# Patient Record
Sex: Male | Born: 1955 | Race: White | Hispanic: No | Marital: Married | Smoking: Never smoker
Health system: Southern US, Community
[De-identification: ages and names within clinical notes are randomized; demographics above are authoritative.]

---

## 2006-05-28 ENCOUNTER — Ambulatory Visit: Payer: Self-pay | Admitting: Internal Medicine

## 2006-06-02 ENCOUNTER — Ambulatory Visit: Payer: Self-pay | Admitting: Internal Medicine

## 2008-12-08 ENCOUNTER — Encounter: Admission: RE | Admit: 2008-12-08 | Discharge: 2008-12-08 | Payer: Self-pay | Admitting: Family Medicine

## 2009-09-11 ENCOUNTER — Encounter: Admission: RE | Admit: 2009-09-11 | Discharge: 2009-09-11 | Payer: Self-pay | Admitting: Neurological Surgery

## 2010-08-04 ENCOUNTER — Encounter: Payer: Self-pay | Admitting: Neurological Surgery

## 2010-11-29 NOTE — Assessment & Plan Note (Signed)
[facility]                           GASTROENTEROLOGY OFFICE NOTE   NAME:[name] [name]                        MRN:          [id number]  DATE:05/28/2006                            DOB:          19-Sep-1955    CHIEF COMPLAINT:  Rectal bleeding.   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  He developed some rectal bleeding on the  tissue paper in July.  At that time he had a perianal fungal infection in  the gluteal crease.  That eventually resolved after some treatment and then  perhaps just spontaneously resolved.  Then, he started having blood on the  tissue paper again on November 1 and then two or three times he saw a streak  of red blood on the outside of a bowel movement.  There is a [name] bit of a  tearing sensation when he defecates.  He has no abdominal pain or other  gastrointestinal symptoms at this time.   MEDICATIONS:  1. Aspirin 81 mg daily.  2. Saw palmetto daily.  3. Flomax 0.4 mg daily.  4. Fish oil twice daily.   DRUG ALLERGIES:  None known.   PAST MEDICAL HISTORY:  1. Allergies.  2. Prostatitis, resolved.  3. 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   FAMILY HISTORY:  No colon cancer reported.  Otherwise, our review is  negative.   SOCIAL HISTORY:  He is married.  He is a professor at [employer].  One son, one  daughter.  No alcohol, tobacco or drugs.   REVIEW OF SYSTEMS:  Eyeglasses and allergies.  All other systems negative at  this time.   PHYSICAL EXAMINATION:  GENERAL:  Well-developed, well-nourished, no acute  distress.  VITAL SIGNS:  Weight 189 pounds, blood pressure 122/82, pulse 88.  ABDOMEN:  Abdomen is soft, nontender, without organomegaly or mass.  RECTAL:  Shows heme negative brown stool, no mass, no protruding hemorrhoids  or other lesions.  It is nontender.  GENITOURINARY:  Prostate is normal.  PSYCHIATRIC:  He is alert and oriented x3.   ASSESSMENT:  Rectal bleeding.  He is 50.  Probably anorectal but  could be  something more significant.   PLAN:  Proceed with colonoscopy to investigate the rectal bleeding.  Further  plans pending that.  Risks, benefits and indications are explained.  He  understands and agrees to proceed.     [name] Boop, MD,FACG  Electronically Signed    CEG/MedQ  DD: 05/28/2006  DT: 05/28/2006  Job #: [id number]   cc:   [name] [name], M.D.

## 2011-12-05 ENCOUNTER — Other Ambulatory Visit: Payer: Self-pay | Admitting: Family Medicine

## 2011-12-05 DIAGNOSIS — M545 Low back pain: Secondary | ICD-10-CM

## 2011-12-17 ENCOUNTER — Ambulatory Visit
Admission: RE | Admit: 2011-12-17 | Discharge: 2011-12-17 | Disposition: A | Discharge status: Home / Self Care | Payer: BC Managed Care – PPO | Source: Ambulatory Visit | Attending: Family Medicine | Admitting: Family Medicine

## 2011-12-17 DIAGNOSIS — M545 Low back pain: Secondary | ICD-10-CM

## 2012-01-12 ENCOUNTER — Other Ambulatory Visit: Payer: Self-pay

## 2013-12-02 ENCOUNTER — Other Ambulatory Visit: Payer: Self-pay | Admitting: Family Medicine

## 2013-12-02 DIAGNOSIS — R0981 Nasal congestion: Secondary | ICD-10-CM

## 2013-12-13 ENCOUNTER — Ambulatory Visit
Admission: RE | Admit: 2013-12-13 | Discharge: 2013-12-13 | Disposition: A | Discharge status: Home / Self Care | Payer: BC Managed Care – PPO | Source: Ambulatory Visit | Attending: Family Medicine | Admitting: Family Medicine

## 2013-12-13 DIAGNOSIS — R0981 Nasal congestion: Secondary | ICD-10-CM

## 2014-01-06 ENCOUNTER — Telehealth: Payer: Self-pay | Admitting: *Deleted

## 2014-01-06 NOTE — Telephone Encounter (Signed)
I had some orthotics that worked well back in 2010 that I got from the [facility].  I need them replaced.  How do I go about doing that?  Leave a message on my machine.  I called and left him a message to give us a call to schedule an appointment with one of the doctors then we can get him scanned to get a new pair of orthotics.

## 2014-01-09 ENCOUNTER — Ambulatory Visit (INDEPENDENT_AMBULATORY_CARE_PROVIDER_SITE_OTHER): Payer: BC Managed Care – PPO | Admitting: Podiatry

## 2014-01-09 ENCOUNTER — Encounter: Payer: Self-pay | Admitting: Podiatry

## 2014-01-09 ENCOUNTER — Ambulatory Visit (INDEPENDENT_AMBULATORY_CARE_PROVIDER_SITE_OTHER): Payer: BC Managed Care – PPO

## 2014-01-09 VITALS — BP 124/82 | HR 77 | Resp 16 | Ht 72.0 in | Wt 192.0 lb

## 2014-01-09 DIAGNOSIS — M948X9 Other specified disorders of cartilage, unspecified sites: Secondary | ICD-10-CM

## 2014-01-09 DIAGNOSIS — M779 Enthesopathy, unspecified: Secondary | ICD-10-CM

## 2014-01-09 MED ORDER — TRIAMCINOLONE ACETONIDE 10 MG/ML IJ SUSP
10.0000 mg | Freq: Once | INTRAMUSCULAR | Status: AC
  Administered 2014-01-09: 10 mg

## 2014-01-09 NOTE — Progress Notes (Signed)
Subjective:     Patient ID: [name] Green, male   DOB: 11/07/1955, 58 y.o.   MRN: [id number]  Foot Pain   patient presents stating my foot has been hurting quite a bit recently and I went to the orthopedic Dr. who did not do anything for me and I have inserted from 5 years ago which are not effective   Review of Systems  All other systems reviewed and are negative.      Objective:   Physical Exam  Nursing note and vitals reviewed. Constitutional: He is oriented to person, place, and time.  Cardiovascular: Intact distal pulses.   Musculoskeletal: Normal range of motion.  Neurological: He is oriented to person, place, and time.  Skin: Skin is warm.   neurovascular status intact with muscle strength adequate in range of motion within normal limits. Patient's digits are well-perfused and patient's arch height is mildly diminished upon weightbearing. 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 Reappoint when orthotics ready

## 2014-01-09 NOTE — Progress Notes (Signed)
   Subjective:    Patient ID: [name] Green, male    DOB: 01/07/1956, 58 y.o.   MRN: [id number]  HPI Comments: "I have pain in the bottom of the big toe area"  Patient c/o aching plantar 1st MPJ right for about 2 months. This is a reoccurring problem. He has seen Dr Irving ShowsEgerton before and was made orthotics. He is go see orthopedist and they xrayed and said no fracture or sesamoiditis. He Rx'd voltaren but hasn't helped.  Going to [place] on Thursday.  Foot Pain      Review of Systems  HENT: Positive for sinus pressure.   Musculoskeletal: Positive for back pain and gait problem.  All other systems reviewed and are negative.      Objective:   Physical Exam        Assessment & Plan:

## 2014-01-30 ENCOUNTER — Other Ambulatory Visit: Payer: BC Managed Care – PPO

## 2014-02-02 ENCOUNTER — Ambulatory Visit (INDEPENDENT_AMBULATORY_CARE_PROVIDER_SITE_OTHER): Payer: BC Managed Care – PPO | Admitting: *Deleted

## 2014-02-02 DIAGNOSIS — M779 Enthesopathy, unspecified: Secondary | ICD-10-CM

## 2014-02-02 NOTE — Progress Notes (Signed)
   Subjective:    Patient ID: [name] Green, male    DOB: 04/06/1956, 58 y.o.   MRN: [id number]  HPI  puo and given instruction.    Review of Systems     Objective:   Physical Exam        Assessment & Plan:

## 2014-02-02 NOTE — Patient Instructions (Signed)

## 2014-10-04 ENCOUNTER — Encounter: Payer: Self-pay | Admitting: Internal Medicine

## 2014-11-15 ENCOUNTER — Other Ambulatory Visit: Payer: Self-pay | Admitting: Urology

## 2014-11-15 DIAGNOSIS — R1031 Right lower quadrant pain: Principal | ICD-10-CM

## 2014-11-15 DIAGNOSIS — G8929 Other chronic pain: Secondary | ICD-10-CM

## 2014-11-20 ENCOUNTER — Ambulatory Visit
Admission: RE | Admit: 2014-11-20 | Discharge: 2014-11-20 | Disposition: A | Discharge status: Home / Self Care | Payer: BC Managed Care – PPO | Source: Ambulatory Visit | Attending: Urology | Admitting: Urology

## 2014-11-20 DIAGNOSIS — R1031 Right lower quadrant pain: Principal | ICD-10-CM

## 2014-11-20 DIAGNOSIS — G8929 Other chronic pain: Secondary | ICD-10-CM

## 2014-11-20 MED ORDER — IOHEXOL 300 MG/ML  SOLN
100.0000 mL | Freq: Once | INTRAMUSCULAR | Status: AC | PRN
  Administered 2014-11-20: 100 mL via INTRAVENOUS

## 2016-05-21 ENCOUNTER — Encounter: Payer: Self-pay | Admitting: Internal Medicine

## 2017-03-04 ENCOUNTER — Ambulatory Visit (INDEPENDENT_AMBULATORY_CARE_PROVIDER_SITE_OTHER): Payer: BC Managed Care – PPO | Admitting: Orthotics

## 2017-03-04 DIAGNOSIS — M2021 Hallux rigidus, right foot: Secondary | ICD-10-CM

## 2017-03-04 NOTE — Progress Notes (Signed)
Patient came in today to pick up custom made foot orthotics.  The goals were accomplished and the patient reported no dissatisfaction with said orthotics.  Patient was advised of breakin period and how to report any issues.  Patient was ADVISED and understood, that the $199.00 2nd/3rd pair was ONLY applicable to additional pairs that were requested within 6 months of original dispensing date.    Since it has been over a year since he has seen dr [name] Green, and two years since Dr [name] Green, patient was advised that the cost billed to [facility] would be $398.00.  He was fine with that.

## 2020-03-14 ENCOUNTER — Other Ambulatory Visit: Payer: Self-pay | Admitting: Orthopedic Surgery

## 2020-03-14 DIAGNOSIS — M545 Low back pain, unspecified: Secondary | ICD-10-CM

## 2020-04-10 ENCOUNTER — Other Ambulatory Visit: Payer: BC Managed Care – PPO

## 2020-05-16 ENCOUNTER — Other Ambulatory Visit: Payer: BC Managed Care – PPO

## 2020-07-17 ENCOUNTER — Ambulatory Visit
Admission: RE | Admit: 2020-07-17 | Discharge: 2020-07-17 | Disposition: A | Discharge status: Home / Self Care | Payer: BC Managed Care – PPO | Source: Ambulatory Visit | Attending: Orthopedic Surgery | Admitting: Orthopedic Surgery

## 2020-07-17 DIAGNOSIS — M545 Low back pain, unspecified: Secondary | ICD-10-CM

## 2022-05-24 IMAGING — MR MR LUMBAR SPINE W/O CM
4 of 5 series · 26 of 48 positions shown · non-contrast
Comparison: Plain films lumbar spine 10/31/2019. MRI lumbar spine
12/17/2011.

CLINICAL DATA: Low back pain for 6 months.  No known injury.

EXAM:
MRI LUMBAR SPINE WITHOUT CONTRAST
TECHNIQUE: Multiplanar, multisequence MR imaging of the lumbar spine was
performed. No intravenous contrast was administered.

[Series 3: T2 post-contrast · sagittal · 4.0mm · 0.57mm/px · 6 of 16 slices shown]
[im 1/16]
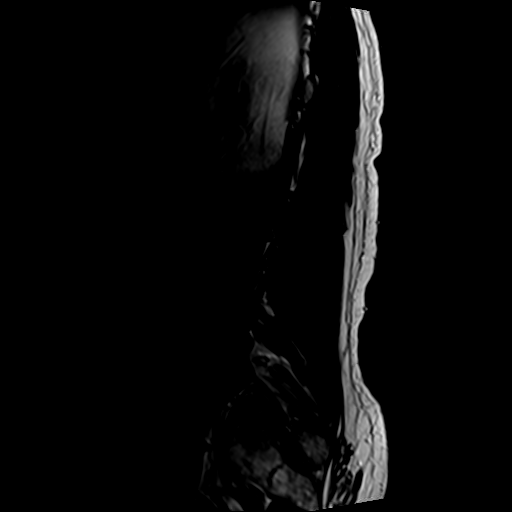
[im 4/16]
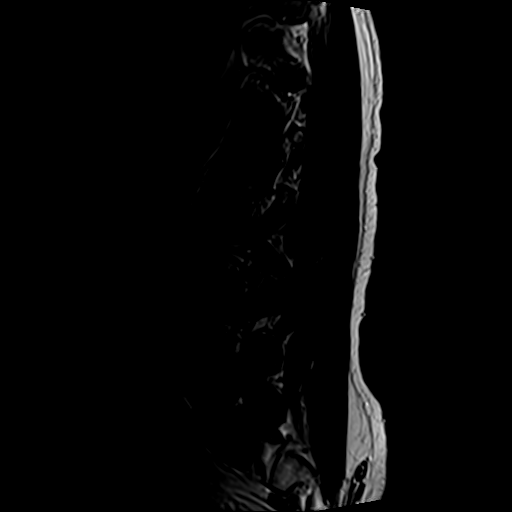
[im 7/16]
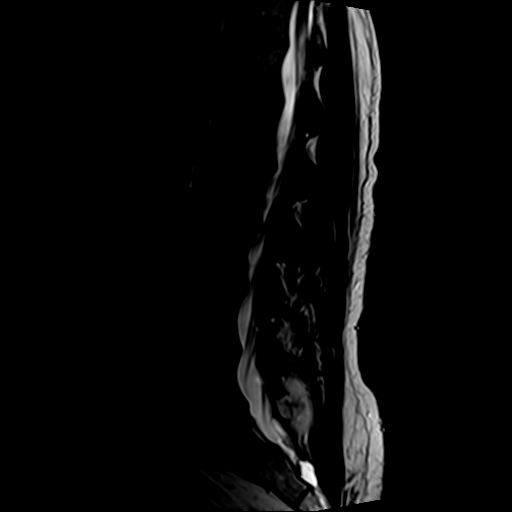
[im 10/16]
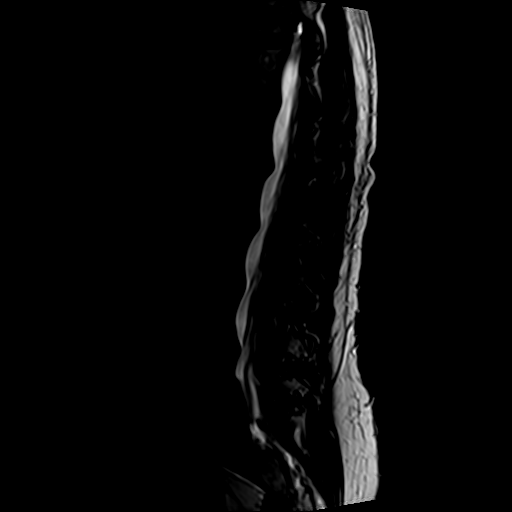
[im 13/16]
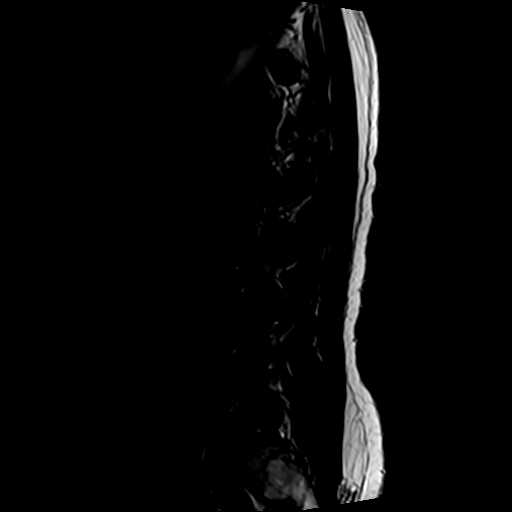
[im 16/16]
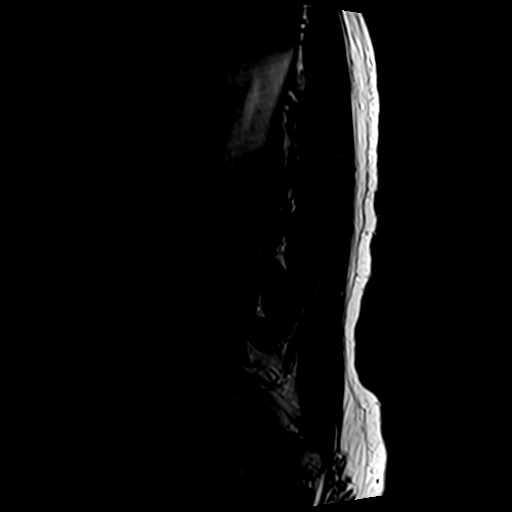

[Series 5: T1 · sagittal · 4.0mm · 0.57mm/px · 6 of 16 slices shown (1 of 2)]
[im 1/16]
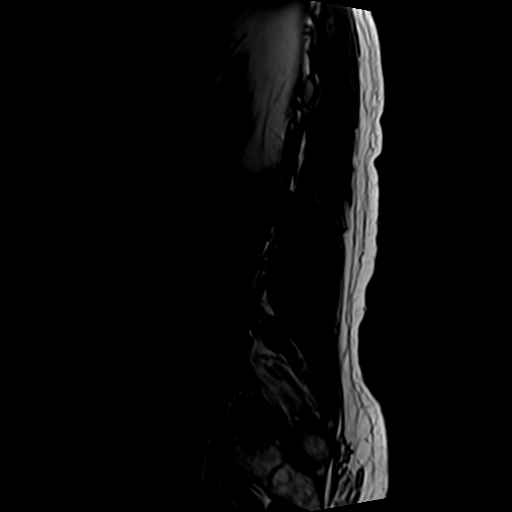
[im 4/16]
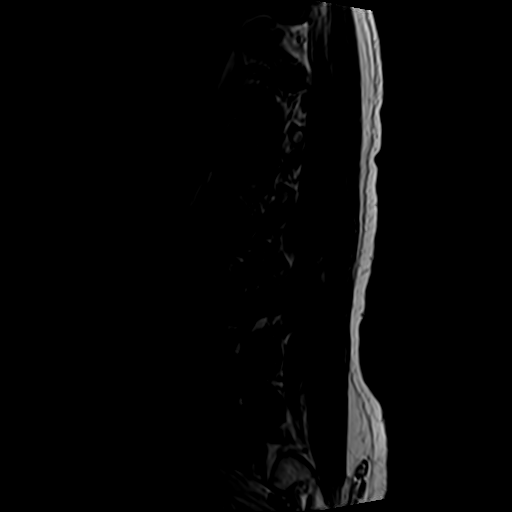
[im 7/16]
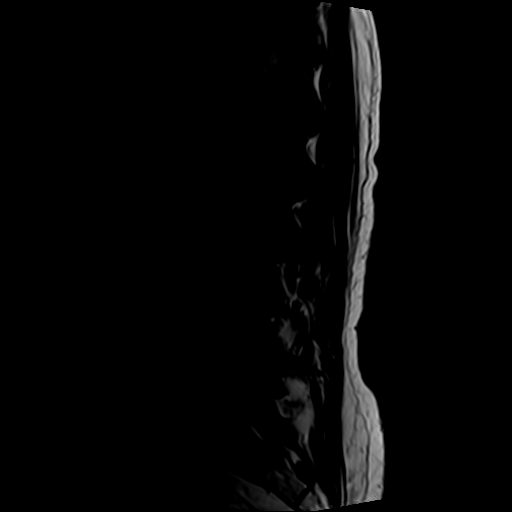
[im 10/16]
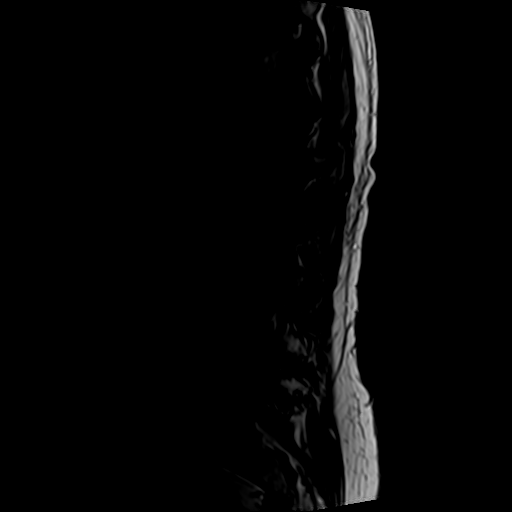
[im 13/16]
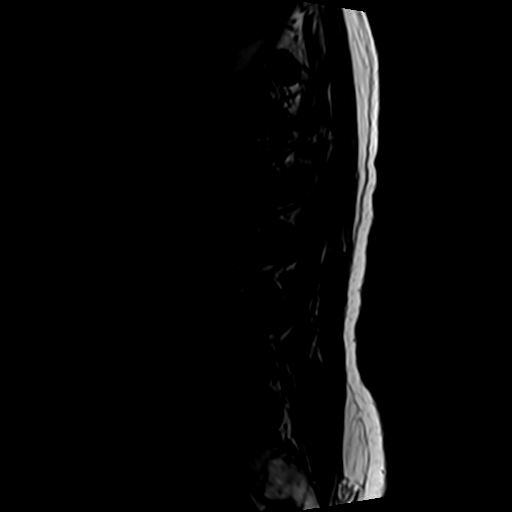
[im 16/16]
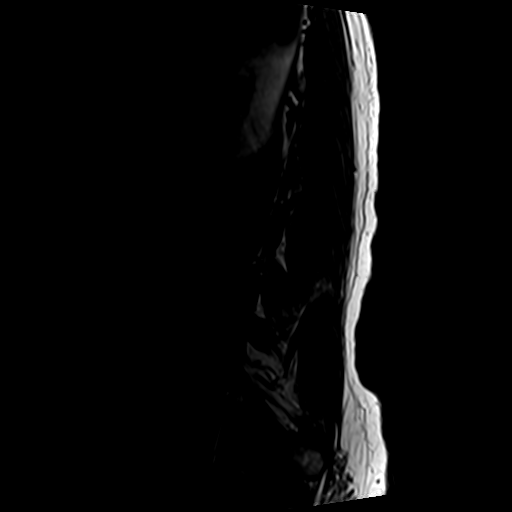

[Series 6: T2 · axial · 4.0mm · 0.70mm/px · z∈[-158,+68]mm · 9 of 40 slices shown]
[im 1/40]
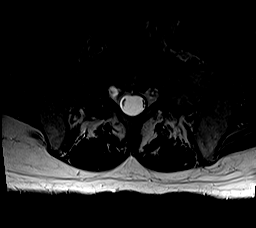
[im 6/40]
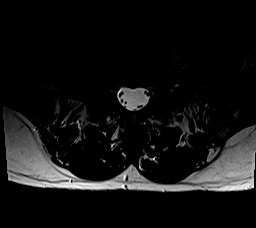
[im 12/40]
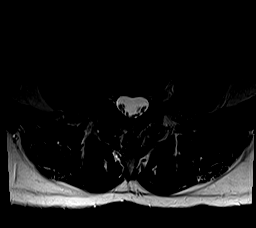
[im 17/40]
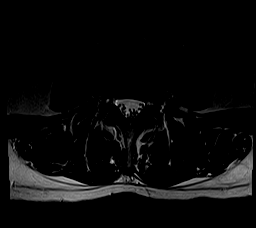
[im 20/40]
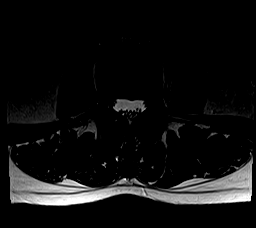
[im 23/40]
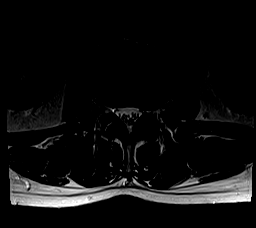
[im 28/40]
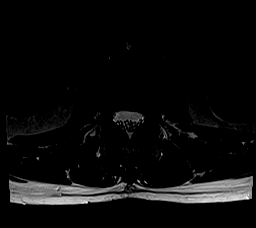
[im 34/40]
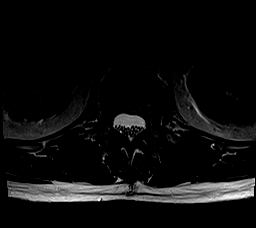
[im 40/40]
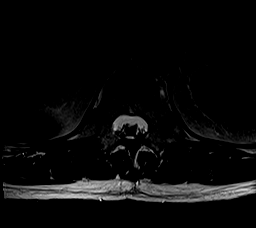

[Series 7: T1 · axial · 4.0mm · 0.35mm/px · z∈[-158,+37]mm · 5 of 40 slices shown (2 of 2)]
[im 1/40]
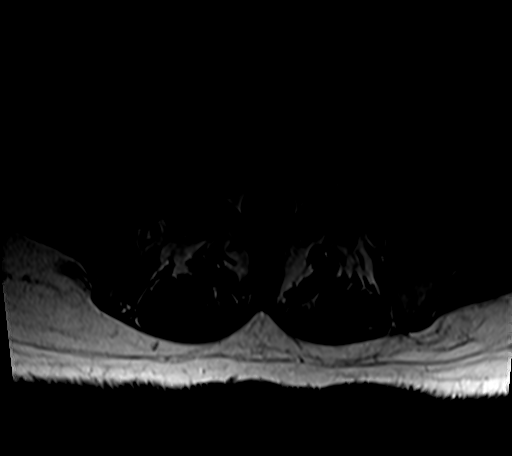
[im 6/40]
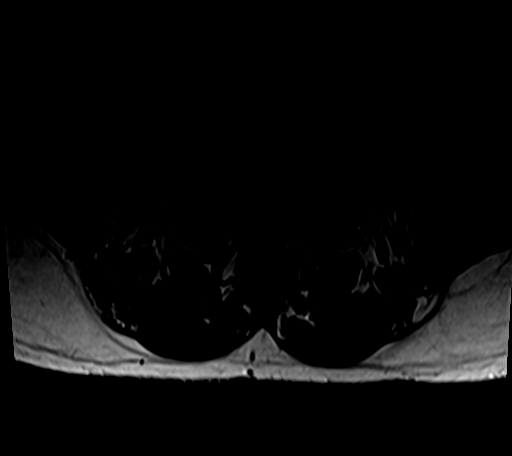
[im 12/40]
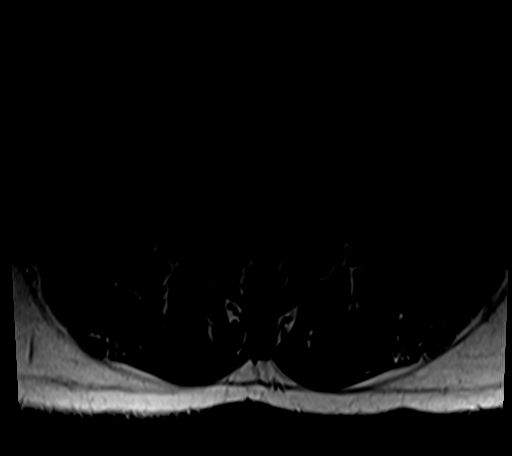
[im 20/40]
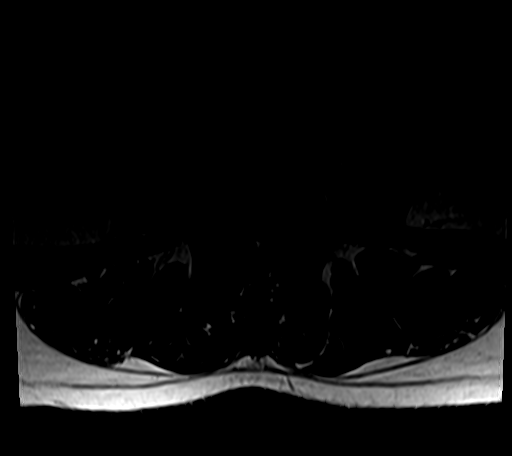
[im 34/40]
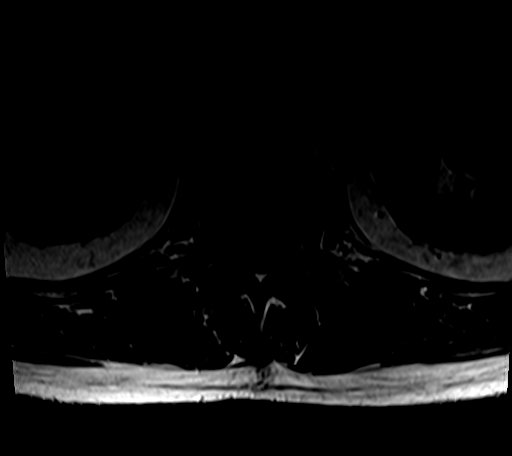

[26 of 48 positions shown; findings below may reference images not displayed]

FINDINGS: Segmentation:  Standard.

Alignment:  Maintained.

Vertebrae:  No fracture, evidence of discitis, or bone lesion.

Conus medullaris and cauda equina: Conus extends to the T12-L1
level. Conus and cauda equina appear normal.

Paraspinal and other soft tissues: Negative.

Disc levels:

T11-12 is imaged in the sagittal plane only. There is a minimal disc
bulge and loss of disc space height but no stenosis.

T12-L1: Negative.

L1-2: Loss of disc space height, shallow bulge and mild facet
degenerative change. No stenosis.

L2-3: Minimal disc bulge and mild facet arthropathy without
stenosis.

L3-4: Mild facet arthropathy and a minimal disc bulge.  No stenosis.

L4-5: Shallow disc bulge and mild facet degenerative change without
stenosis.

L5-S1: Minimal disc bulge and mild facet arthropathy.  No stenosis.
IMPRESSION: No change in mild degenerative disease of the lumbar spine without
central canal or foraminal narrowing.

## 2023-08-27 DIAGNOSIS — Z136 Encounter for screening for cardiovascular disorders: Secondary | ICD-10-CM | POA: Diagnosis not present

## 2023-08-27 DIAGNOSIS — Z0000 Encounter for general adult medical examination without abnormal findings: Secondary | ICD-10-CM | POA: Diagnosis not present

## 2023-08-27 DIAGNOSIS — Z1211 Encounter for screening for malignant neoplasm of colon: Secondary | ICD-10-CM | POA: Diagnosis not present

## 2023-08-27 DIAGNOSIS — Z125 Encounter for screening for malignant neoplasm of prostate: Secondary | ICD-10-CM | POA: Diagnosis not present

## 2023-08-31 DIAGNOSIS — Z0000 Encounter for general adult medical examination without abnormal findings: Secondary | ICD-10-CM | POA: Diagnosis not present

## 2023-08-31 DIAGNOSIS — N400 Enlarged prostate without lower urinary tract symptoms: Secondary | ICD-10-CM | POA: Diagnosis not present

## 2023-08-31 DIAGNOSIS — G8929 Other chronic pain: Secondary | ICD-10-CM | POA: Diagnosis not present

## 2023-08-31 DIAGNOSIS — M545 Low back pain, unspecified: Secondary | ICD-10-CM | POA: Diagnosis not present

## 2023-10-08 DIAGNOSIS — H2513 Age-related nuclear cataract, bilateral: Secondary | ICD-10-CM | POA: Diagnosis not present

## 2023-11-03 DIAGNOSIS — H2181 Floppy iris syndrome: Secondary | ICD-10-CM | POA: Diagnosis not present

## 2023-11-03 DIAGNOSIS — H25812 Combined forms of age-related cataract, left eye: Secondary | ICD-10-CM | POA: Diagnosis not present

## 2023-11-12 DIAGNOSIS — H2511 Age-related nuclear cataract, right eye: Secondary | ICD-10-CM | POA: Diagnosis not present

## 2023-11-17 DIAGNOSIS — H25811 Combined forms of age-related cataract, right eye: Secondary | ICD-10-CM | POA: Diagnosis not present

## 2023-11-17 DIAGNOSIS — Z7983 Long term (current) use of bisphosphonates: Secondary | ICD-10-CM | POA: Diagnosis not present

## 2024-04-21 DIAGNOSIS — L821 Other seborrheic keratosis: Secondary | ICD-10-CM | POA: Diagnosis not present

## 2024-06-16 DIAGNOSIS — Z961 Presence of intraocular lens: Secondary | ICD-10-CM | POA: Diagnosis not present
